# Patient Record
Sex: Female | Born: 1990 | Race: White | Hispanic: No | Marital: Single | Smoking: Never smoker
Health system: Southern US, Community
[De-identification: ages and names within clinical notes are randomized; demographics above are authoritative.]

## PROBLEM LIST (undated history)

## (undated) HISTORY — PX: WISDOM TOOTH EXTRACTION: SHX21

---

## 2015-11-01 ENCOUNTER — Encounter (HOSPITAL_COMMUNITY): Payer: Self-pay | Admitting: Emergency Medicine

## 2015-11-01 ENCOUNTER — Emergency Department (HOSPITAL_COMMUNITY): Payer: BLUE CROSS/BLUE SHIELD

## 2015-11-01 ENCOUNTER — Emergency Department (HOSPITAL_COMMUNITY)
Admission: EM | Admit: 2015-11-01 | Discharge: 2015-11-01 | Disposition: A | Discharge status: Home / Self Care | Payer: BLUE CROSS/BLUE SHIELD | Attending: Emergency Medicine | Admitting: Emergency Medicine

## 2015-11-01 DIAGNOSIS — R112 Nausea with vomiting, unspecified: Secondary | ICD-10-CM | POA: Insufficient documentation

## 2015-11-01 DIAGNOSIS — R1032 Left lower quadrant pain: Secondary | ICD-10-CM | POA: Diagnosis not present

## 2015-11-01 DIAGNOSIS — Z793 Long term (current) use of hormonal contraceptives: Secondary | ICD-10-CM | POA: Insufficient documentation

## 2015-11-01 DIAGNOSIS — Z3202 Encounter for pregnancy test, result negative: Secondary | ICD-10-CM | POA: Diagnosis not present

## 2015-11-01 DIAGNOSIS — Z880 Allergy status to penicillin: Secondary | ICD-10-CM | POA: Insufficient documentation

## 2015-11-01 DIAGNOSIS — R1033 Periumbilical pain: Secondary | ICD-10-CM | POA: Diagnosis present

## 2015-11-01 LAB — COMPREHENSIVE METABOLIC PANEL
ALBUMIN: 4 g/dL (ref 3.5–5.0)
ALT: 15 U/L (ref 14–54)
AST: 20 U/L (ref 15–41)
Alkaline Phosphatase: 38 U/L (ref 38–126)
Anion gap: 12 (ref 5–15)
BILIRUBIN TOTAL: 0.8 mg/dL (ref 0.3–1.2)
BUN: 13 mg/dL (ref 6–20)
CO2: 21 mmol/L — ABNORMAL LOW (ref 22–32)
Calcium: 9.3 mg/dL (ref 8.9–10.3)
Chloride: 107 mmol/L (ref 101–111)
Creatinine, Ser: 0.82 mg/dL (ref 0.44–1.00)
GFR calc Af Amer: >60 mL/min (ref >60)
GFR calc non Af Amer: >60 mL/min (ref >60)
GLUCOSE: 111 mg/dL — AB (ref 65–99)
POTASSIUM: 4.3 mmol/L (ref 3.5–5.1)
Sodium: 140 mmol/L (ref 135–145)
TOTAL PROTEIN: 6.9 g/dL (ref 6.5–8.1)

## 2015-11-01 LAB — CBC
HEMATOCRIT: 39.9 % (ref 36.0–46.0)
Hemoglobin: 13.5 g/dL (ref 12.0–15.0)
MCH: 32.6 pg (ref 26.0–34.0)
MCHC: 33.8 g/dL (ref 30.0–36.0)
MCV: 96.4 fL (ref 78.0–100.0)
Platelets: 224 10*3/uL (ref 150–400)
RBC: 4.14 MIL/uL (ref 3.87–5.11)
RDW: 13.8 % (ref 11.5–15.5)
WBC: 15.7 10*3/uL — ABNORMAL HIGH (ref 4.0–10.5)

## 2015-11-01 LAB — URINALYSIS, ROUTINE W REFLEX MICROSCOPIC
BILIRUBIN URINE: NEGATIVE
GLUCOSE, UA: NEGATIVE mg/dL
KETONES UR: NEGATIVE mg/dL
NITRITE: NEGATIVE
PH: 5.5 (ref 5.0–8.0)
Protein, ur: NEGATIVE mg/dL
Specific Gravity, Urine: 1.022 (ref 1.005–1.030)

## 2015-11-01 LAB — POC URINE PREG, ED: Preg Test, Ur: NEGATIVE

## 2015-11-01 LAB — URINE MICROSCOPIC-ADD ON

## 2015-11-01 LAB — LIPASE, BLOOD: Lipase: 24 U/L (ref 11–51)

## 2015-11-01 MED ORDER — MORPHINE SULFATE (PF) 4 MG/ML IV SOLN: 4.0000 mg | Freq: Once | INTRAVENOUS | Status: DC

## 2015-11-01 MED ORDER — KETOROLAC TROMETHAMINE 10 MG PO TABS: 10.0000 mg | ORAL_TABLET | Freq: Four times a day (QID) | ORAL | Status: DC | PRN

## 2015-11-01 MED ORDER — IOHEXOL 300 MG/ML  SOLN
100.0000 mL | Freq: Once | INTRAMUSCULAR | Status: AC | PRN
  Administered 2015-11-01: 100 mL via INTRAVENOUS

## 2015-11-01 MED ORDER — FENTANYL CITRATE (PF) 100 MCG/2ML IJ SOLN
50.0000 ug | Freq: Once | INTRAMUSCULAR | Status: AC
  Administered 2015-11-01: 50 ug via INTRAVENOUS
  Filled 2015-11-01: qty 2

## 2015-11-01 MED ORDER — OXYCODONE-ACETAMINOPHEN 5-325 MG PO TABS
1.0000 | ORAL_TABLET | Freq: Once | ORAL | Status: AC
  Administered 2015-11-01: 1 via ORAL
  Filled 2015-11-01: qty 1

## 2015-11-01 MED ORDER — MORPHINE SULFATE (PF) 2 MG/ML IV SOLN
2.0000 mg | Freq: Once | INTRAVENOUS | Status: AC
  Administered 2015-11-01: 2 mg via INTRAVENOUS
  Filled 2015-11-01: qty 1

## 2015-11-01 MED ORDER — KETOROLAC TROMETHAMINE 30 MG/ML IJ SOLN
30.0000 mg | Freq: Once | INTRAMUSCULAR | Status: AC
  Administered 2015-11-01: 30 mg via INTRAVENOUS
  Filled 2015-11-01: qty 1

## 2015-11-01 MED ORDER — ONDANSETRON HCL 4 MG/2ML IJ SOLN
4.0000 mg | Freq: Once | INTRAMUSCULAR | Status: AC
  Administered 2015-11-01: 4 mg via INTRAVENOUS
  Filled 2015-11-01: qty 2

## 2015-11-01 MED ORDER — ONDANSETRON HCL 4 MG/2ML IJ SOLN
4.0000 mg | Freq: Once | INTRAMUSCULAR | Status: DC
  Filled 2015-11-01: qty 2

## 2015-11-01 MED ORDER — OXYCODONE-ACETAMINOPHEN 5-325 MG PO TABS
2.0000 | ORAL_TABLET | ORAL | Status: DC | PRN
Start: 2015-11-01 — End: 2024-05-08

## 2015-11-01 NOTE — ED Provider Notes (Signed)
CSN: [id number]     Arrival date & time 11/01/15  [id number] History   First MD Initiated Contact with Patient 11/01/15 1025     Chief Complaint  Patient presents with  . Abdominal Pain    HPI   24 year old female presents today with acute onset of abdominal pain with associated nausea and vomiting. Patient reports no complaints last night prior to going to bed, woke up approximately 7 AM with slight abdominal discomfort, with rapid worsening of abdominal pain. She describes it as tight and cramping located in the left lower periumbilical region. She denies any radiation of symptoms, reports 2 episodes of nonbloody bloody nonprojectile vomiting. She denies any fever, chills, upper abdominal pain, lower abdominal pain vaginal bleeding or discharge. Patient denies any changes to the color clarity or characteristics of her urine. She reports having a normal bowel movement this morning followed by a small amount of loose stool. She reports that she is not sexually active, no history of trauma to the abdomen, no history of STDs.  History reviewed. No pertinent past medical history. Past Surgical History  Procedure Laterality Date  . Wisdom tooth extraction     No family history on file. Social History  Substance Use Topics  . Smoking status: Never Smoker   . Smokeless tobacco: Never Used  . Alcohol Use: 8.4 oz/week    14 Glasses of wine per week   OB History    No data available     Review of Systems  All other systems reviewed and are negative.   Allergies  Amoxapine and related and Amoxicillin  Home Medications   Prior to Admission medications   Medication Sig Start Date End Date Taking? Authorizing Provider  MICROGESTIN FE 1/20 1-20 MG-MCG tablet Take 1 tablet by mouth daily. 10/19/15  Yes Historical Provider, MD  ketorolac (TORADOL) 10 MG tablet Take 1 tablet (10 mg total) by mouth every 6 (six) hours as needed. 11/01/15   Eyvonne MechanicJeffrey Render Marley, PA-C  oxyCODONE-acetaminophen  (PERCOCET/ROXICET) 5-325 MG tablet Take 2 tablets by mouth every 4 (four) hours as needed for severe pain. 11/01/15   Kyoko Elsea, PA-C   BP 125/86 mmHg  Pulse 79  Temp(Src) 98.4 F (36.9 C) (Oral)  Resp 16  Ht 5\' 5"  (1.651 m)  Wt 59.875 kg  BMI 21.97 kg/m2  SpO2 100%  LMP 10/02/2015   Physical Exam  Constitutional: She is oriented to person, place, and time. She appears well-developed and well-nourished.  HENT:  Head: Normocephalic and atraumatic.  Eyes: Conjunctivae are normal. Pupils are equal, round, and reactive to light. Right eye exhibits no discharge. Left eye exhibits no discharge. No scleral icterus.  Neck: Normal range of motion. No JVD present. No tracheal deviation present.  Cardiovascular: Normal rate, regular rhythm and intact distal pulses.  Exam reveals no gallop and no friction rub.   No murmur heard. Pulmonary/Chest: Effort normal and breath sounds normal. No stridor. No respiratory distress. She has no wheezes. She has no rales. She exhibits no tenderness.  Abdominal: Soft. Bowel sounds are normal. She exhibits no distension and no mass. There is tenderness. There is no rebound and no guarding.    Neurological: She is alert and oriented to person, place, and time. Coordination normal.  Psychiatric: She has a normal mood and affect. Her behavior is normal. Judgment and thought content normal.  Nursing note and vitals reviewed.   ED Course  Procedures (including critical care time) Labs Review Labs Reviewed  COMPREHENSIVE METABOLIC PANEL -  Abnormal; Notable for the following:    CO2 21 (*)    Glucose, Bld 111 (*)    All other components within normal limits  CBC - Abnormal; Notable for the following:    WBC 15.7 (*)    All other components within normal limits  URINALYSIS, ROUTINE W REFLEX MICROSCOPIC (NOT AT [facility]) - Abnormal; Notable for the following:    APPearance CLOUDY (*)    Hgb urine dipstick SMALL (*)    Leukocytes, UA SMALL (*)    All other  components within normal limits  URINE MICROSCOPIC-ADD ON - Abnormal; Notable for the following:    Squamous Epithelial / LPF 0-5 (*)    Bacteria, UA FEW (*)    All other components within normal limits  LIPASE, BLOOD  POC URINE PREG, ED    Imaging Review Ct Abdomen Pelvis W Contrast  11/01/2015  CLINICAL DATA:  Left lower quadrant abdominal pelvic pain with nausea and vomiting EXAM: CT ABDOMEN AND PELVIS WITH CONTRAST TECHNIQUE: Multidetector CT imaging of the abdomen and pelvis was performed using the standard protocol following bolus administration of intravenous contrast. CONTRAST:  OMNIPAQUE IOHEXOL 300 MG/ML  SOLN COMPARISON:  None. FINDINGS: Lower chest: Clear lung bases. Pectus excavatum deformity noted. Normal heart size. No pericardial or pleural effusion. Hepatobiliary: No masses or other significant abnormality. Pancreas: No mass, inflammatory changes, or other significant abnormality. Spleen: Within normal limits in size and appearance. Adrenals/Urinary Tract: Normal adrenal glands. Left kidney demonstrates delayed enhancement pattern with mild left hydronephrosis and hydroureter. Dilated ureter extends into the pelvis and is difficult to follow along the left pelvic sidewall. Bladder is collapsed. In the region of the left UVJ, there is a small 3 mm obstructing calculus demonstrated, confirmed on the sagittal and coronal reconstructions. Right kidney and ureter are normal. Stomach/Bowel: No evidence of obstruction, inflammatory process, or abnormal fluid collections. Vascular/Lymphatic: No pathologically enlarged lymph nodes. No evidence of abdominal aortic aneurysm. Reproductive: Lobulated contour to the retroverted uterus posteriorly, suspect small fibroids. No adnexal or ovarian abnormality. Other: No inguinal abnormality or hernia.  Intact abdominal wall. Musculoskeletal:  No acute osseous finding. IMPRESSION: 3mm mildly obstructing left distal UVJ calculus with mild left  hydroureteronephrosis and delayed enhancement of the left Kidney. Moderate pectus excavatum deformity of the lower chest. Probable small posterior uterine fibroids Electronically Signed   By: Judie Petit.  Shick M.D.   On: 11/01/2015 12:28   I have personally reviewed and evaluated these images and lab results as part of my medical decision-making.   EKG Interpretation None      MDM   Final diagnoses:  LLQ abdominal pain    Labs: CMP, CBC, urinalysis, lipase, POC urine preg- CBC 15.7  Imaging: CT abdomen and pelvis significant for 3 mm mildly obstructing left distal UVJ calculus with mild left hydroureteronephrosis and delayed enhancement of the left kidney  Consults:  Therapeutics: Fentanyl, Zofran, morphine, Toradol  Discharge Meds:   Assessment/Plan: 24 year old female presents today with acute onset of abdominal pain. CT scan shows a 3 mm mildly obstructing left UVJ stone, patient's pain dramatically improved with Toradol here in the ED. She appears nontoxic, no acute signs of urinary infection. Patient will be discharged home with pain medication, antinausea medication, follow up with urology for further evaluation and management. Patient was given strict return cautioned, verbalized understanding and agreement to today's plan had no further questions or concerns at time of discharge.            Tinnie Gens  Izabelle Daus, PA-C 11/01/15 1634  Doug Sou, MD 11/01/15 3086

## 2015-11-01 NOTE — ED Notes (Signed)
Maximiano CossJ, Hedge, PA, at bedside.

## 2015-11-01 NOTE — Discharge Instructions (Signed)
Please read attached information. If you experience any new or worsening signs or symptoms please return to the emergency room for evaluation. Please follow-up with your primary care provider or specialist as discussed. Please use medication prescribed only as directed and discontinue taking if you have any concerning signs or symptoms.   °

## 2015-11-01 NOTE — ED Notes (Signed)
Pt reports sudden onset abdominal pain this am followed by N/V.  Pt unable to sit still, grimacing.

## 2017-04-01 IMAGING — CT CT ABD-PELV W/ CM
2 of 4 series · 16 of 46 positions shown, 18 images · IV contrast (Omni 300)
Comparison: None.

CLINICAL DATA: Left lower quadrant abdominal pelvic pain with
nausea and vomiting

EXAM:
CT ABDOMEN AND PELVIS WITH CONTRAST
TECHNIQUE: Multidetector CT imaging of the abdomen and pelvis was performed
using the standard protocol following bolus administration of
intravenous contrast.
CONTRAST:  100mL OMNIPAQUE IOHEXOL 300 MG/ML  SOLN

[Series 3: a/p w/ 5mm · axial · 0.63mm/px · z∈[-1022,-587]mm · 13 of 95 slices shown, 15 images]
[im 4/95  soft-tissue]
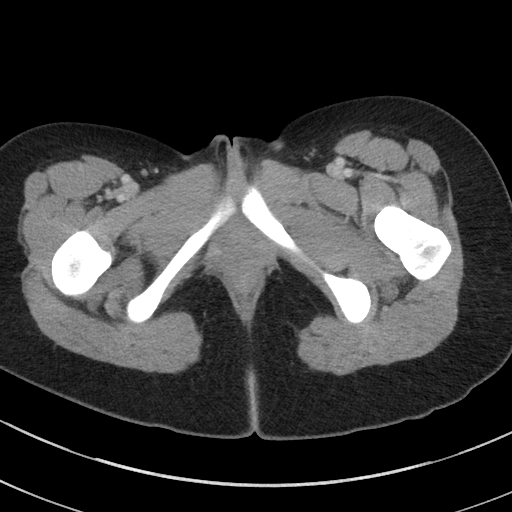
[im 4/95  bone]
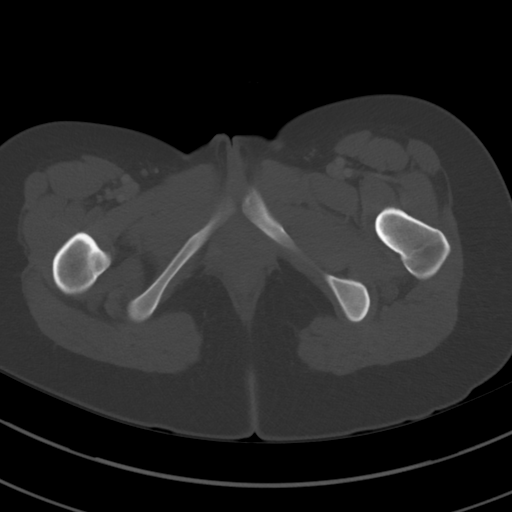
[im 12/95  soft-tissue]
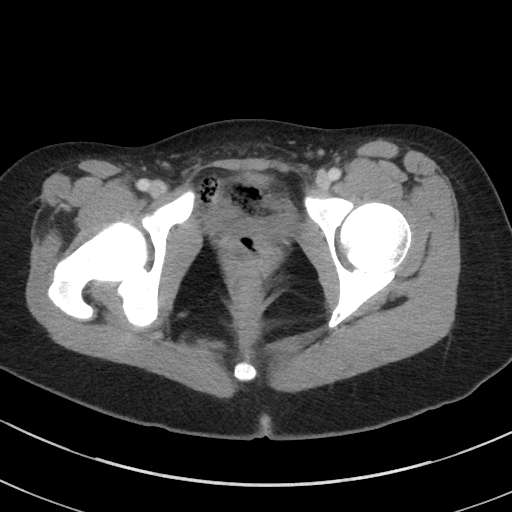
[im 20/95  soft-tissue]
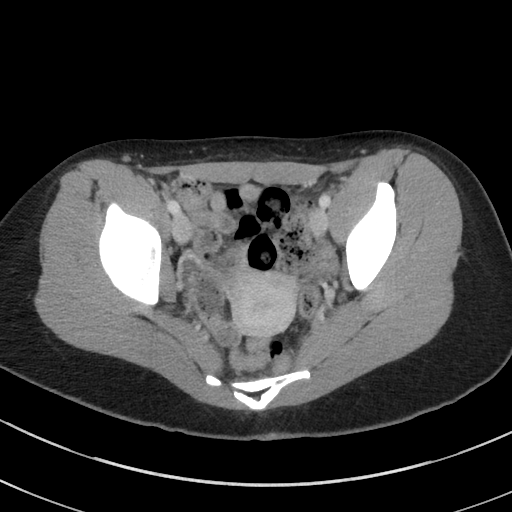
[im 28/95  soft-tissue]
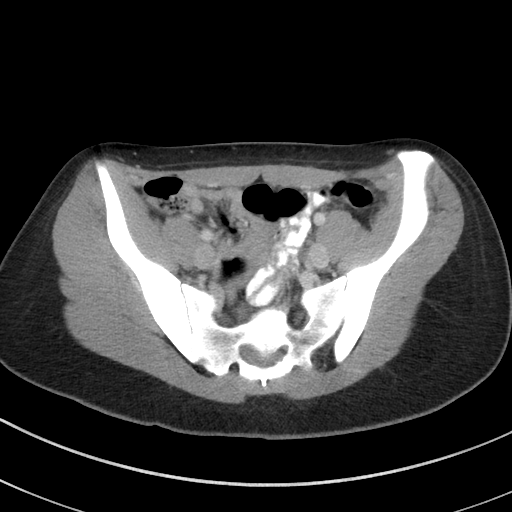
[im 32/95  soft-tissue]
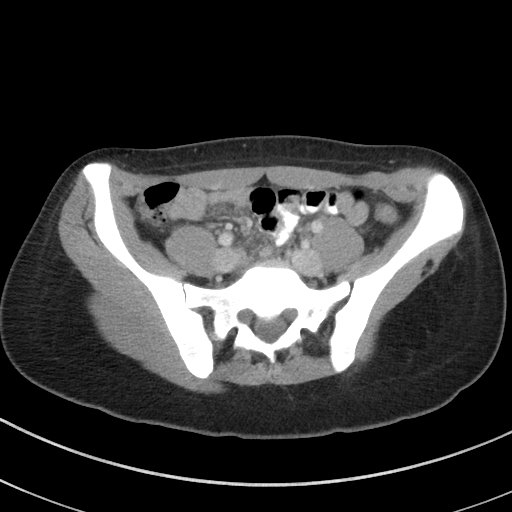
[im 40/95  soft-tissue]
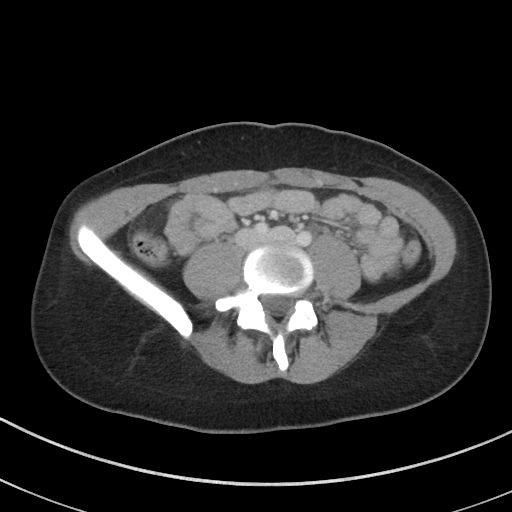
[im 48/95  soft-tissue]
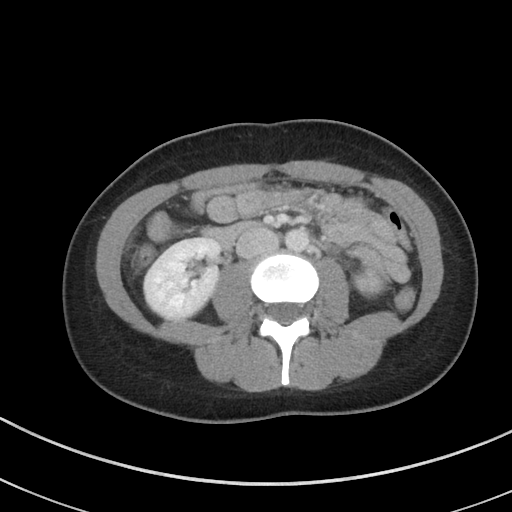
[im 55/95  soft-tissue]
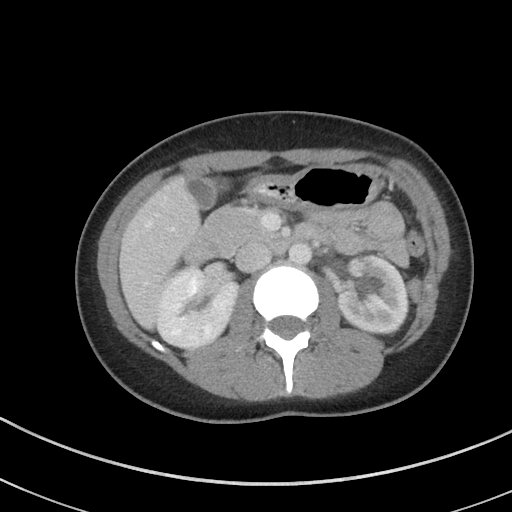
[im 63/95  soft-tissue]
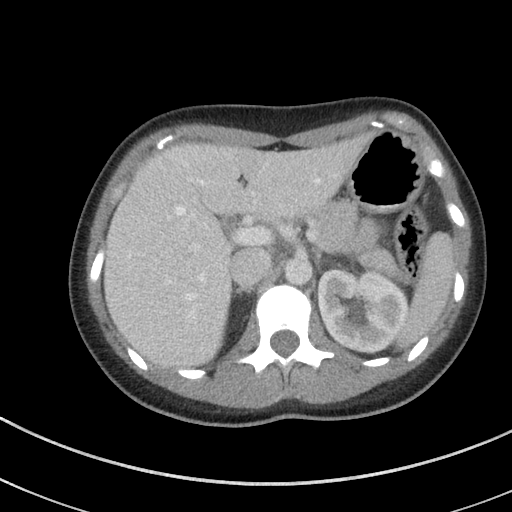
[im 63/95  bone]
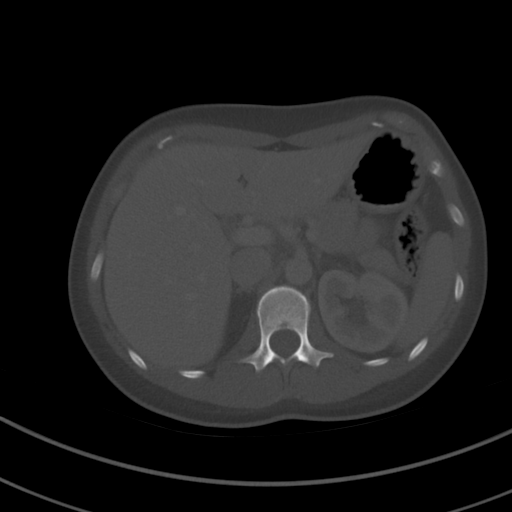
[im 67/95  soft-tissue]
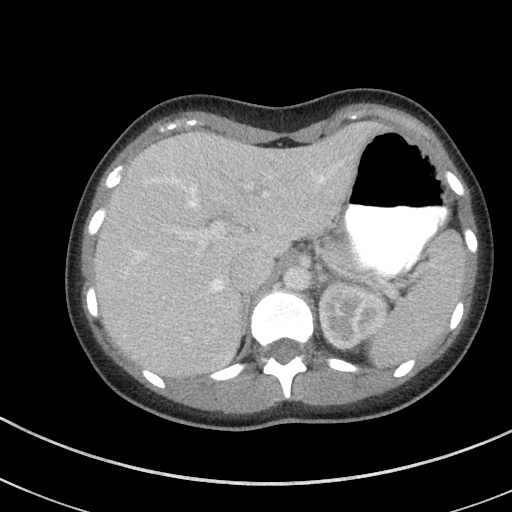
[im 75/95  soft-tissue]
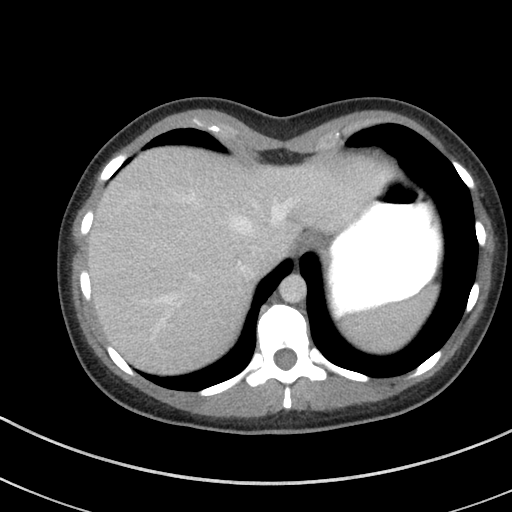
[im 83/95  soft-tissue]
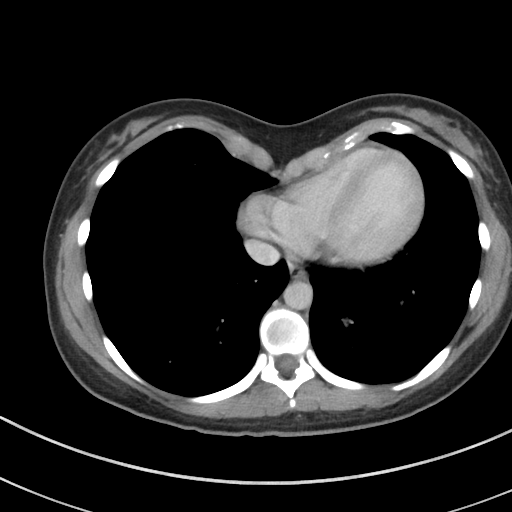
[im 91/95  soft-tissue]
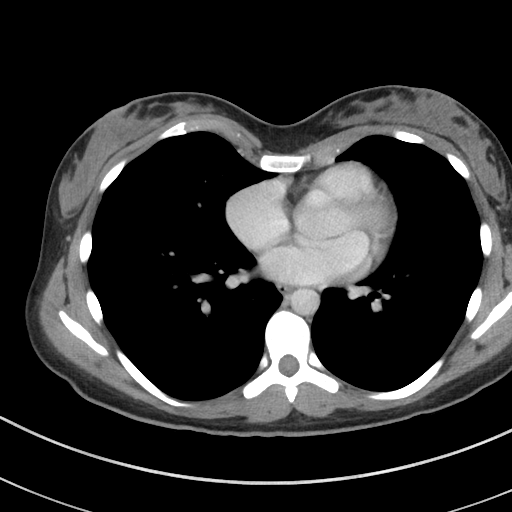

[Series 6: a/p w/ cor · coronal · 0.65mm/px · 3 of 95 slices shown]
[im 32/95  soft-tissue]
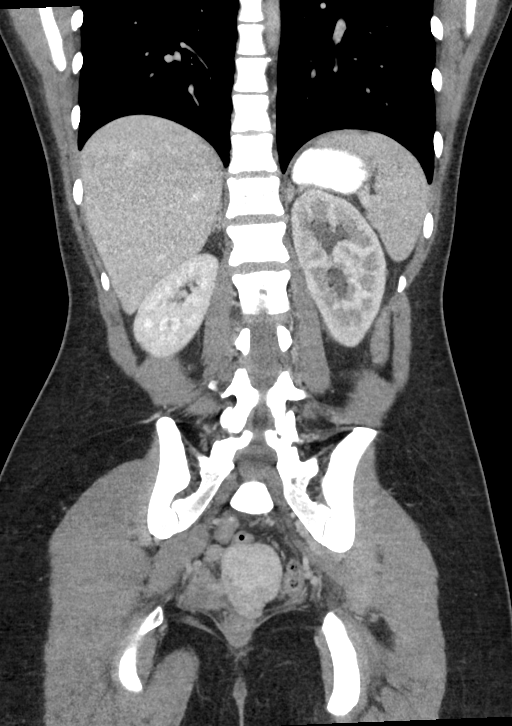
[im 42/95  soft-tissue]
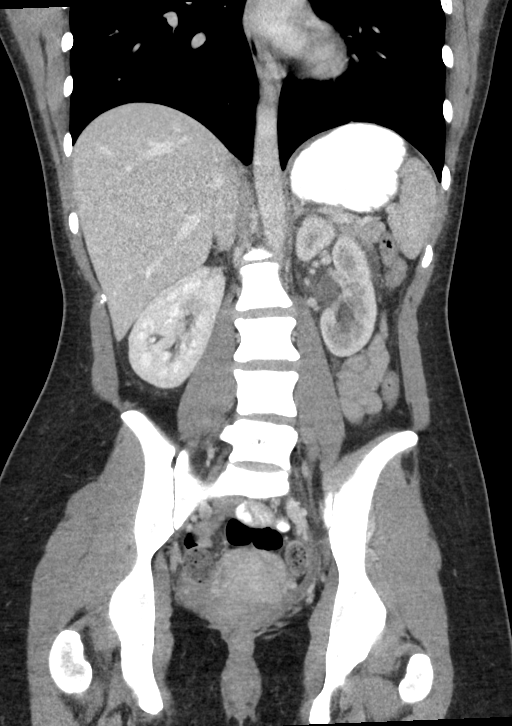
[im 53/95  soft-tissue]
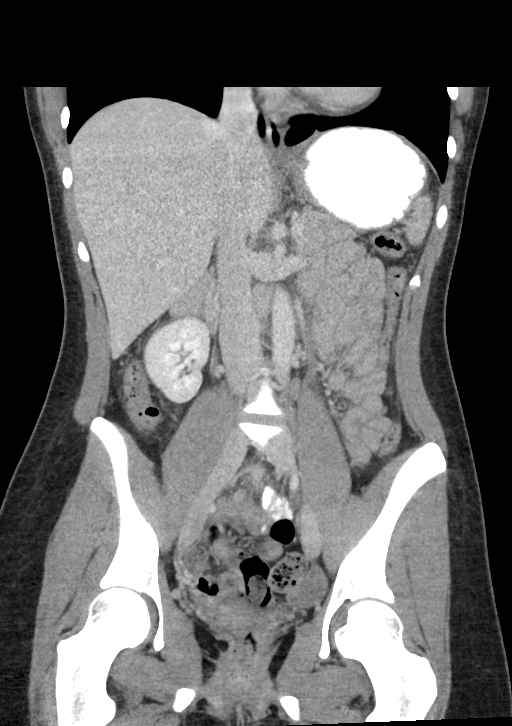

[16 of 46 positions shown; findings below may reference images not displayed]

FINDINGS: Lower chest: Clear lung bases. Pectus excavatum deformity noted.
Normal heart size. No pericardial or pleural effusion.

Hepatobiliary: No masses or other significant abnormality.

Pancreas: No mass, inflammatory changes, or other significant
abnormality.

Spleen: Within normal limits in size and appearance.

Adrenals/Urinary Tract: Normal adrenal glands.

Left kidney demonstrates delayed enhancement pattern with mild left
hydronephrosis and hydroureter. Dilated ureter extends into the
pelvis and is difficult to follow along the left pelvic sidewall.
Bladder is collapsed. In the region of the left UVJ, there is a
small 3 mm obstructing calculus demonstrated, confirmed on the
sagittal and coronal reconstructions.

Right kidney and ureter are normal.

Stomach/Bowel: No evidence of obstruction, inflammatory process, or
abnormal fluid collections.

Vascular/Lymphatic: No pathologically enlarged lymph nodes. No
evidence of abdominal aortic aneurysm.

Reproductive: Lobulated contour to the retroverted uterus
posteriorly, suspect small fibroids. No adnexal or ovarian
abnormality.

Other: No inguinal abnormality or hernia.  Intact abdominal wall.

Musculoskeletal:  No acute osseous finding.
IMPRESSION: 3mm mildly obstructing left distal UVJ calculus with mild left
hydroureteronephrosis and delayed enhancement of the left

Kidney.

Moderate pectus excavatum deformity of the lower chest.

Probable small posterior uterine fibroids

## 2024-05-08 ENCOUNTER — Encounter (HOSPITAL_COMMUNITY): Payer: Self-pay

## 2024-05-08 ENCOUNTER — Ambulatory Visit (HOSPITAL_COMMUNITY)
Admission: EM | Admit: 2024-05-08 | Discharge: 2024-05-08 | Disposition: A | Discharge status: Home / Self Care | Attending: Emergency Medicine | Admitting: Emergency Medicine

## 2024-05-08 DIAGNOSIS — M25511 Pain in right shoulder: Secondary | ICD-10-CM

## 2024-05-08 DIAGNOSIS — M7551 Bursitis of right shoulder: Secondary | ICD-10-CM

## 2024-05-08 MED ORDER — PREDNISONE 10 MG (21) PO TBPK: ORAL_TABLET | Freq: Every day | ORAL | 0 refills | Status: AC

## 2024-05-08 NOTE — ED Provider Notes (Signed)
 [facility]    CSN: [id number] Arrival date & time: 05/08/24  1216      History   Chief Complaint Chief Complaint  Patient presents with   Shoulder Pain    HPI Nicole Yang is a 33 y.o. female.   Patient presents today with right shoulder pain that feels deep into the joint since yesterday.  Patient has been lifting and moving boxes and noticed the discomfort.  Patient had similar symptoms  in the same area that was relieved with NSAIDs.  Patient took an Advil prior to arrival with minimal relief.  Denies any chest pain no shortness of breath no neck pain    History reviewed. No pertinent past medical history.  There are no active problems to display for this patient.   Past Surgical History:  Procedure Laterality Date   WISDOM TOOTH EXTRACTION      OB History   No obstetric history on file.      Home Medications    Prior to Admission medications   Medication Sig Start Date End Date Taking? Authorizing Provider  predniSONE (STERAPRED UNI-PAK 21 TAB) 10 MG (21) TBPK tablet Take by mouth daily. Take 6 tabs by mouth daily  for 2 days, then 5 tabs for 2 days, then 4 tabs for 2 days, then 3 tabs for 2 days, 2 tabs for 2 days, then 1 tab by mouth daily for 2 days 05/08/24  Yes Merilee Andrea CROME, NP  MICROGESTIN FE 1/20 1-20 MG-MCG tablet Take 1 tablet by mouth daily. 10/19/15   [provider]    Family History History reviewed. No pertinent family history.  Social History Social History   Tobacco Use   Smoking status: Never   Smokeless tobacco: Never  Substance Use Topics   Alcohol use: Yes    Alcohol/week: 14.0 standard drinks of alcohol    Types: 14 Glasses of wine per week   Drug use: No     Allergies   Amoxapine and related and Amoxicillin   Review of Systems Review of Systems  Constitutional: Negative.   Respiratory: Negative.    Cardiovascular: Negative.   Gastrointestinal: Negative.   Genitourinary: Negative.    Musculoskeletal:        Right shoulder pain feels deep   Skin: Negative.   Neurological: Negative.  Negative for dizziness, light-headedness and headaches.     Physical Exam Triage Vital Signs ED Triage Vitals  Encounter Vitals Group     BP 05/08/24 1301 133/79     Girls Systolic BP Percentile --      Girls Diastolic BP Percentile --      Boys Systolic BP Percentile --      Boys Diastolic BP Percentile --      Pulse Rate 05/08/24 1301 75     Resp 05/08/24 1301 18     Temp 05/08/24 1301 98.6 F (37 C)     Temp Source 05/08/24 1301 Oral     SpO2 05/08/24 1301 98 %     Weight --      Height --      Head Circumference --      Peak Flow --      Pain Score 05/08/24 1302 7     Pain Loc --      Pain Education --      Exclude from Growth Chart --    No data found.  Updated Vital Signs BP 133/79 (BP Location: Right Arm)   Pulse 75  Temp 98.6 F (37 C) (Oral)   Resp 18   SpO2 98%   Visual Acuity Right Eye Distance:   Left Eye Distance:   Bilateral Distance:    Right Eye Near:   Left Eye Near:    Bilateral Near:     Physical Exam Constitutional:      Appearance: Normal appearance.   Cardiovascular:     Rate and Rhythm: Normal rate.  Pulmonary:     Effort: Pulmonary effort is normal.  Abdominal:     General: Abdomen is flat.   Musculoskeletal:        General: Tenderness present. No signs of injury. Normal range of motion.     Comments: Right shoulder pain deep into the bursa joint.  Has full range of motion.   Skin:    General: Skin is warm and dry.   Neurological:     Mental Status: She is alert.      UC Treatments / Results  Labs (all labs ordered are listed, but only abnormal results are displayed) Labs Reviewed - No data to display  EKG   Radiology No results found.  Procedures Procedures (including critical care time)  Medications Ordered in UC Medications - No data to display  Initial Impression / Assessment and Plan / UC Course   I have reviewed the triage vital signs and the nursing notes.  Pertinent labs & imaging results that were available during my care of the patient were reviewed by me and considered in my medical decision making (see chart for details).     Use warm compresses on this area several times a day Continue to take Aleve or NSAIDs as needed for pain Patient is from [place] and will need to follow-up with an orthopedic if symptoms persist after 1 week Try massage or stretching exercises prior to lifting in the future  Final Clinical Impressions(s) / UC Diagnoses   Final diagnoses:  Acute pain of right shoulder  Bursitis of right shoulder   Discharge Instructions   None    ED Prescriptions     Medication Sig Dispense Auth. Provider   predniSONE (STERAPRED UNI-PAK 21 TAB) 10 MG (21) TBPK tablet Take by mouth daily. Take 6 tabs by mouth daily  for 2 days, then 5 tabs for 2 days, then 4 tabs for 2 days, then 3 tabs for 2 days, 2 tabs for 2 days, then 1 tab by mouth daily for 2 days 42 tablet Merilee Andrea CROME, NP      PDMP not reviewed this encounter.   Merilee Andrea CROME, NP 05/08/24 [phone]

## 2024-05-08 NOTE — ED Triage Notes (Signed)
 Pt c/o rt shoulder pain since yesterday. States has been moving and lifting heavy items. Took advil with little relief.
